# Patient Record
Sex: Male | Born: 1940 | Race: White | Hispanic: No | Smoking: Never smoker
Health system: Southern US, Community
[De-identification: ages and names within clinical notes are randomized; demographics above are authoritative.]

## PROBLEM LIST (undated history)

## (undated) DIAGNOSIS — I10 Essential (primary) hypertension: Secondary | ICD-10-CM

---

## 2016-03-02 ENCOUNTER — Encounter (HOSPITAL_COMMUNITY): Payer: Self-pay | Admitting: Emergency Medicine

## 2016-03-02 ENCOUNTER — Emergency Department (HOSPITAL_COMMUNITY): Payer: PPO

## 2016-03-02 ENCOUNTER — Observation Stay (HOSPITAL_COMMUNITY)
Admission: EM | Admit: 2016-03-02 | Discharge: 2016-03-05 | Disposition: A | Discharge status: Home / Self Care | Payer: PPO | Attending: Orthopedic Surgery | Admitting: Orthopedic Surgery

## 2016-03-02 DIAGNOSIS — I10 Essential (primary) hypertension: Secondary | ICD-10-CM | POA: Insufficient documentation

## 2016-03-02 DIAGNOSIS — Y9389 Activity, other specified: Secondary | ICD-10-CM | POA: Diagnosis not present

## 2016-03-02 DIAGNOSIS — I6523 Occlusion and stenosis of bilateral carotid arteries: Secondary | ICD-10-CM | POA: Diagnosis not present

## 2016-03-02 DIAGNOSIS — I7409 Other arterial embolism and thrombosis of abdominal aorta: Secondary | ICD-10-CM | POA: Insufficient documentation

## 2016-03-02 DIAGNOSIS — S52022A Displaced fracture of olecranon process without intraarticular extension of left ulna, initial encounter for closed fracture: Principal | ICD-10-CM | POA: Insufficient documentation

## 2016-03-02 DIAGNOSIS — S301XXA Contusion of abdominal wall, initial encounter: Secondary | ICD-10-CM | POA: Diagnosis present

## 2016-03-02 DIAGNOSIS — Y92488 Other paved roadways as the place of occurrence of the external cause: Secondary | ICD-10-CM | POA: Insufficient documentation

## 2016-03-02 DIAGNOSIS — S2222XA Fracture of body of sternum, initial encounter for closed fracture: Secondary | ICD-10-CM | POA: Diagnosis not present

## 2016-03-02 DIAGNOSIS — K5730 Diverticulosis of large intestine without perforation or abscess without bleeding: Secondary | ICD-10-CM | POA: Diagnosis not present

## 2016-03-02 DIAGNOSIS — Y998 Other external cause status: Secondary | ICD-10-CM | POA: Diagnosis not present

## 2016-03-02 DIAGNOSIS — I7389 Other specified peripheral vascular diseases: Secondary | ICD-10-CM | POA: Diagnosis not present

## 2016-03-02 DIAGNOSIS — I700 Atherosclerosis of aorta: Secondary | ICD-10-CM | POA: Diagnosis not present

## 2016-03-02 DIAGNOSIS — S52023A Displaced fracture of olecranon process without intraarticular extension of unspecified ulna, initial encounter for closed fracture: Secondary | ICD-10-CM

## 2016-03-02 DIAGNOSIS — S52092B Other fracture of upper end of left ulna, initial encounter for open fracture type I or II: Secondary | ICD-10-CM

## 2016-03-02 DIAGNOSIS — I2510 Atherosclerotic heart disease of native coronary artery without angina pectoris: Secondary | ICD-10-CM | POA: Insufficient documentation

## 2016-03-02 DIAGNOSIS — S42402A Unspecified fracture of lower end of left humerus, initial encounter for closed fracture: Secondary | ICD-10-CM | POA: Diagnosis present

## 2016-03-02 DIAGNOSIS — S2220XA Unspecified fracture of sternum, initial encounter for closed fracture: Secondary | ICD-10-CM | POA: Diagnosis present

## 2016-03-02 DIAGNOSIS — I513 Intracardiac thrombosis, not elsewhere classified: Secondary | ICD-10-CM | POA: Insufficient documentation

## 2016-03-02 DIAGNOSIS — S3991XA Unspecified injury of abdomen, initial encounter: Secondary | ICD-10-CM

## 2016-03-02 HISTORY — DX: Essential (primary) hypertension: I10

## 2016-03-02 LAB — CBC WITH DIFFERENTIAL/PLATELET
BASOS PCT: 0 %
Basophils Absolute: 0 10*3/uL (ref 0.0–0.1)
EOS PCT: 0 %
Eosinophils Absolute: 0 10*3/uL (ref 0.0–0.7)
HEMATOCRIT: 40.6 % (ref 39.0–52.0)
HEMOGLOBIN: 14.7 g/dL (ref 13.0–17.0)
Lymphocytes Relative: 3 %
Lymphs Abs: 0.6 10*3/uL — ABNORMAL LOW (ref 0.7–4.0)
MCH: 31.1 pg (ref 26.0–34.0)
MCHC: 36.2 g/dL — ABNORMAL HIGH (ref 30.0–36.0)
MCV: 86 fL (ref 78.0–100.0)
MONOS PCT: 10 %
Monocytes Absolute: 1.9 10*3/uL — ABNORMAL HIGH (ref 0.1–1.0)
NEUTROS PCT: 87 %
Neutro Abs: 16.3 10*3/uL — ABNORMAL HIGH (ref 1.7–7.7)
PLATELETS: 165 10*3/uL (ref 150–400)
RBC: 4.72 MIL/uL (ref 4.22–5.81)
RDW: 12.8 % (ref 11.5–15.5)
WBC: 18.8 10*3/uL — AB (ref 4.0–10.5)

## 2016-03-02 LAB — COMPREHENSIVE METABOLIC PANEL
ALBUMIN: 3.8 g/dL (ref 3.5–5.0)
ALK PHOS: 73 U/L (ref 38–126)
ALT: 31 U/L (ref 17–63)
ANION GAP: 12 (ref 5–15)
AST: 46 U/L — AB (ref 15–41)
BILIRUBIN TOTAL: 0.6 mg/dL (ref 0.3–1.2)
BUN: 9 mg/dL (ref 6–20)
CO2: 21 mmol/L — ABNORMAL LOW (ref 22–32)
Calcium: 8.5 mg/dL — ABNORMAL LOW (ref 8.9–10.3)
Chloride: 99 mmol/L — ABNORMAL LOW (ref 101–111)
Creatinine, Ser: 0.8 mg/dL (ref 0.61–1.24)
GFR calc Af Amer: >60 mL/min (ref >60)
GFR calc non Af Amer: >60 mL/min (ref >60)
GLUCOSE: 133 mg/dL — AB (ref 65–99)
POTASSIUM: 3.4 mmol/L — AB (ref 3.5–5.1)
SODIUM: 132 mmol/L — AB (ref 135–145)
TOTAL PROTEIN: 6.5 g/dL (ref 6.5–8.1)

## 2016-03-02 LAB — PROTIME-INR
INR: 1.08
PROTHROMBIN TIME: 14.1 s (ref 11.4–15.2)

## 2016-03-02 LAB — TYPE AND SCREEN
ABO/RH(D): O POS
Antibody Screen: NEGATIVE

## 2016-03-02 LAB — ABO/RH: ABO/RH(D): O POS

## 2016-03-02 LAB — TROPONIN I: Troponin I: <0.03 ng/mL (ref <0.03)

## 2016-03-02 LAB — ETHANOL: Alcohol, Ethyl (B): 24 mg/dL — ABNORMAL HIGH (ref <5)

## 2016-03-02 MED ORDER — MORPHINE SULFATE (PF) 4 MG/ML IV SOLN
4.0000 mg | Freq: Once | INTRAVENOUS | Status: AC
  Administered 2016-03-03: 4 mg via INTRAVENOUS
  Filled 2016-03-02: qty 1

## 2016-03-02 MED ORDER — IOPAMIDOL (ISOVUE-300) INJECTION 61%
INTRAVENOUS | Status: AC
  Filled 2016-03-02: qty 100

## 2016-03-02 MED ORDER — IOPAMIDOL (ISOVUE-300) INJECTION 61%
100.0000 mL | Freq: Once | INTRAVENOUS | Status: AC | PRN
  Administered 2016-03-02: 100 mL via INTRAVENOUS

## 2016-03-02 MED ORDER — SODIUM CHLORIDE 0.9 % IV SOLN
Freq: Once | INTRAVENOUS | Status: AC
  Administered 2016-03-02: 23:00:00 via INTRAVENOUS

## 2016-03-02 NOTE — ED Notes (Signed)
Patient transported to X-ray via Doctor, general practicestretcher with transport
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
## 2016-03-02 NOTE — ED Notes (Addendum)
Spoke with pt daughter Delaney Meigsamara. Per pt, staff is allowed to update her on all medical findings during his stay if she calls.  Please update when all results are in and pt plan updated [phone]

## 2016-03-03 ENCOUNTER — Observation Stay (HOSPITAL_COMMUNITY): Payer: PPO | Admitting: Certified Registered"

## 2016-03-03 ENCOUNTER — Encounter (HOSPITAL_COMMUNITY): Payer: Self-pay | Admitting: Certified Registered"

## 2016-03-03 ENCOUNTER — Encounter (HOSPITAL_COMMUNITY)
Admission: EM | Disposition: A | Discharge status: Home / Self Care | Payer: Self-pay | Source: Home / Self Care | Attending: Emergency Medicine

## 2016-03-03 ENCOUNTER — Observation Stay (HOSPITAL_COMMUNITY): Payer: PPO

## 2016-03-03 DIAGNOSIS — I10 Essential (primary) hypertension: Secondary | ICD-10-CM | POA: Diagnosis not present

## 2016-03-03 DIAGNOSIS — S2222XA Fracture of body of sternum, initial encounter for closed fracture: Secondary | ICD-10-CM | POA: Diagnosis not present

## 2016-03-03 DIAGNOSIS — S301XXA Contusion of abdominal wall, initial encounter: Secondary | ICD-10-CM | POA: Diagnosis not present

## 2016-03-03 DIAGNOSIS — S52022A Displaced fracture of olecranon process without intraarticular extension of left ulna, initial encounter for closed fracture: Secondary | ICD-10-CM | POA: Diagnosis not present

## 2016-03-03 HISTORY — PX: ORIF ELBOW FRACTURE: SHX5031

## 2016-03-03 LAB — CBC
HCT: 38.8 % — ABNORMAL LOW (ref 39.0–52.0)
HEMOGLOBIN: 13.5 g/dL (ref 13.0–17.0)
MCH: 30.3 pg (ref 26.0–34.0)
MCHC: 34.8 g/dL (ref 30.0–36.0)
MCV: 87 fL (ref 78.0–100.0)
PLATELETS: 135 10*3/uL — AB (ref 150–400)
RBC: 4.46 MIL/uL (ref 4.22–5.81)
RDW: 13.1 % (ref 11.5–15.5)
WBC: 10.5 10*3/uL (ref 4.0–10.5)

## 2016-03-03 LAB — URINE MICROSCOPIC-ADD ON

## 2016-03-03 LAB — RAPID URINE DRUG SCREEN, HOSP PERFORMED
AMPHETAMINES: NOT DETECTED
BARBITURATES: NOT DETECTED
BENZODIAZEPINES: NOT DETECTED
Cocaine: NOT DETECTED
Opiates: POSITIVE — AB
Tetrahydrocannabinol: NOT DETECTED

## 2016-03-03 LAB — CREATININE, SERUM
CREATININE: 0.92 mg/dL (ref 0.61–1.24)
GFR calc Af Amer: >60 mL/min (ref >60)

## 2016-03-03 LAB — URINALYSIS, ROUTINE W REFLEX MICROSCOPIC
Bilirubin Urine: NEGATIVE
GLUCOSE, UA: NEGATIVE mg/dL
KETONES UR: NEGATIVE mg/dL
LEUKOCYTES UA: NEGATIVE
Nitrite: NEGATIVE
PH: 5.5 (ref 5.0–8.0)
Protein, ur: 100 mg/dL — AB
Specific Gravity, Urine: 1.031 — ABNORMAL HIGH (ref 1.005–1.030)

## 2016-03-03 SURGERY — OPEN REDUCTION INTERNAL FIXATION (ORIF) ELBOW/OLECRANON FRACTURE
Anesthesia: General | Site: Elbow | Laterality: Left

## 2016-03-03 MED ORDER — PHENYLEPHRINE 40 MCG/ML (10ML) SYRINGE FOR IV PUSH (FOR BLOOD PRESSURE SUPPORT)
PREFILLED_SYRINGE | INTRAVENOUS | Status: AC
  Filled 2016-03-03: qty 10

## 2016-03-03 MED ORDER — ACETAMINOPHEN 650 MG RE SUPP: 650.0000 mg | Freq: Four times a day (QID) | RECTAL | Status: DC | PRN

## 2016-03-03 MED ORDER — OXYCODONE HCL 5 MG PO TABS
5.0000 mg | ORAL_TABLET | ORAL | Status: DC | PRN
  Administered 2016-03-04: 15 mg via ORAL
  Administered 2016-03-04 (×2): 10 mg via ORAL
  Administered 2016-03-04 – 2016-03-05 (×2): 15 mg via ORAL
  Filled 2016-03-03: qty 3
  Filled 2016-03-03: qty 2
  Filled 2016-03-03 (×2): qty 3
  Filled 2016-03-03: qty 2

## 2016-03-03 MED ORDER — TETANUS-DIPHTHERIA TOXOIDS TD 2-2 LF/0.5ML IM SUSP
0.5000 mL | Freq: Once | INTRAMUSCULAR | Status: AC
  Administered 2016-03-03: 0.5 mL via INTRAMUSCULAR

## 2016-03-03 MED ORDER — PROPOFOL 10 MG/ML IV BOLUS
INTRAVENOUS | Status: AC
  Filled 2016-03-03: qty 20

## 2016-03-03 MED ORDER — HYDROMORPHONE HCL 2 MG/ML IJ SOLN: 1.0000 mg | INTRAMUSCULAR | Status: DC | PRN

## 2016-03-03 MED ORDER — ENOXAPARIN SODIUM 40 MG/0.4ML ~~LOC~~ SOLN
40.0000 mg | SUBCUTANEOUS | Status: DC
  Administered 2016-03-03: 40 mg via SUBCUTANEOUS
  Filled 2016-03-03: qty 0.4

## 2016-03-03 MED ORDER — BUPIVACAINE HCL (PF) 0.25 % IJ SOLN
INTRAMUSCULAR | Status: AC
  Filled 2016-03-03: qty 10

## 2016-03-03 MED ORDER — SUCCINYLCHOLINE CHLORIDE 20 MG/ML IJ SOLN
INTRAMUSCULAR | Status: DC | PRN
  Administered 2016-03-03: 100 mg via INTRAVENOUS

## 2016-03-03 MED ORDER — EPHEDRINE 5 MG/ML INJ
INTRAVENOUS | Status: AC
  Filled 2016-03-03: qty 10

## 2016-03-03 MED ORDER — HYDROMORPHONE HCL 2 MG/ML IJ SOLN: 0.5000 mg | INTRAMUSCULAR | Status: DC | PRN

## 2016-03-03 MED ORDER — 0.9 % SODIUM CHLORIDE (POUR BTL) OPTIME
TOPICAL | Status: DC | PRN
  Administered 2016-03-03: 1000 mL

## 2016-03-03 MED ORDER — ONDANSETRON HCL 4 MG PO TABS: 4.0000 mg | ORAL_TABLET | Freq: Four times a day (QID) | ORAL | Status: DC | PRN

## 2016-03-03 MED ORDER — DOCUSATE SODIUM 100 MG PO CAPS
100.0000 mg | ORAL_CAPSULE | Freq: Two times a day (BID) | ORAL | Status: DC
  Administered 2016-03-03 – 2016-03-05 (×5): 100 mg via ORAL
  Filled 2016-03-03 (×3): qty 1

## 2016-03-03 MED ORDER — EPHEDRINE SULFATE 50 MG/ML IJ SOLN
INTRAMUSCULAR | Status: DC | PRN
  Administered 2016-03-03 (×2): 10 mg via INTRAVENOUS

## 2016-03-03 MED ORDER — CEFAZOLIN SODIUM-DEXTROSE 2-3 GM-% IV SOLR
INTRAVENOUS | Status: DC | PRN
  Administered 2016-03-03: 2 g via INTRAVENOUS

## 2016-03-03 MED ORDER — ONDANSETRON HCL 4 MG/2ML IJ SOLN
INTRAMUSCULAR | Status: DC | PRN
  Administered 2016-03-03: 4 mg via INTRAVENOUS

## 2016-03-03 MED ORDER — ONDANSETRON HCL 4 MG/2ML IJ SOLN
INTRAMUSCULAR | Status: AC
  Filled 2016-03-03: qty 2

## 2016-03-03 MED ORDER — DEXTROSE-NACL 5-0.9 % IV SOLN
INTRAVENOUS | Status: DC
  Administered 2016-03-03: 08:00:00 via INTRAVENOUS

## 2016-03-03 MED ORDER — BUPIVACAINE HCL 0.25 % IJ SOLN
INTRAMUSCULAR | Status: DC | PRN
  Administered 2016-03-03: 10 mL

## 2016-03-03 MED ORDER — ACETAMINOPHEN 325 MG PO TABS
650.0000 mg | ORAL_TABLET | Freq: Four times a day (QID) | ORAL | Status: DC | PRN
  Administered 2016-03-05: 650 mg via ORAL
  Filled 2016-03-03: qty 2

## 2016-03-03 MED ORDER — LIDOCAINE HCL (CARDIAC) 20 MG/ML IV SOLN
INTRAVENOUS | Status: DC | PRN
  Administered 2016-03-03: 60 mg via INTRAVENOUS

## 2016-03-03 MED ORDER — SODIUM CHLORIDE 0.9 % IR SOLN
Status: DC | PRN
  Administered 2016-03-03: 3000 mL

## 2016-03-03 MED ORDER — PHENYLEPHRINE HCL 10 MG/ML IJ SOLN
INTRAMUSCULAR | Status: DC | PRN
  Administered 2016-03-03 (×2): 80 ug via INTRAVENOUS

## 2016-03-03 MED ORDER — FENTANYL CITRATE (PF) 100 MCG/2ML IJ SOLN
INTRAMUSCULAR | Status: AC
  Filled 2016-03-03: qty 2

## 2016-03-03 MED ORDER — ONDANSETRON HCL 4 MG/2ML IJ SOLN: 4.0000 mg | Freq: Four times a day (QID) | INTRAMUSCULAR | Status: DC | PRN

## 2016-03-03 MED ORDER — ENOXAPARIN SODIUM 30 MG/0.3ML ~~LOC~~ SOLN
30.0000 mg | Freq: Two times a day (BID) | SUBCUTANEOUS | Status: DC
  Administered 2016-03-04 – 2016-03-05 (×3): 30 mg via SUBCUTANEOUS
  Filled 2016-03-03 (×3): qty 0.3

## 2016-03-03 MED ORDER — SUCCINYLCHOLINE CHLORIDE 200 MG/10ML IV SOSY
PREFILLED_SYRINGE | INTRAVENOUS | Status: AC
  Filled 2016-03-03: qty 10

## 2016-03-03 MED ORDER — PROPOFOL 10 MG/ML IV BOLUS
INTRAVENOUS | Status: DC | PRN
  Administered 2016-03-03: 150 mg via INTRAVENOUS
  Administered 2016-03-03: 30 mg via INTRAVENOUS

## 2016-03-03 MED ORDER — TETANUS-DIPHTHERIA TOXOIDS TD 5-2 LFU IM INJ
0.5000 mL | INJECTION | Freq: Once | INTRAMUSCULAR | Status: DC
  Filled 2016-03-03: qty 0.5

## 2016-03-03 MED ORDER — METOCLOPRAMIDE HCL 5 MG/ML IJ SOLN: 5.0000 mg | Freq: Three times a day (TID) | INTRAMUSCULAR | Status: DC | PRN

## 2016-03-03 MED ORDER — METOPROLOL TARTRATE 25 MG PO TABS
50.0000 mg | ORAL_TABLET | Freq: Every day | ORAL | Status: DC
  Administered 2016-03-03 – 2016-03-05 (×3): 50 mg via ORAL
  Filled 2016-03-03 (×3): qty 2

## 2016-03-03 MED ORDER — POLYETHYLENE GLYCOL 3350 17 G PO PACK
17.0000 g | PACK | Freq: Every day | ORAL | Status: DC
  Administered 2016-03-03 – 2016-03-05 (×3): 17 g via ORAL
  Filled 2016-03-03 (×2): qty 1

## 2016-03-03 MED ORDER — CEFAZOLIN SODIUM-DEXTROSE 2-4 GM/100ML-% IV SOLN
2.0000 g | Freq: Four times a day (QID) | INTRAVENOUS | Status: AC
  Administered 2016-03-03 (×3): 2 g via INTRAVENOUS
  Filled 2016-03-03 (×4): qty 100

## 2016-03-03 MED ORDER — KETOTIFEN FUMARATE 0.025 % OP SOLN
1.0000 [drp] | Freq: Two times a day (BID) | OPHTHALMIC | Status: DC | PRN
  Filled 2016-03-03: qty 5

## 2016-03-03 MED ORDER — LACTATED RINGERS IV SOLN
INTRAVENOUS | Status: DC | PRN
  Administered 2016-03-03 (×2): via INTRAVENOUS

## 2016-03-03 MED ORDER — FENTANYL CITRATE (PF) 100 MCG/2ML IJ SOLN
INTRAMUSCULAR | Status: DC | PRN
  Administered 2016-03-03 (×2): 50 ug via INTRAVENOUS

## 2016-03-03 MED ORDER — OXYCODONE HCL 5 MG PO TABS: 5.0000 mg | ORAL_TABLET | ORAL | Status: DC | PRN

## 2016-03-03 MED ORDER — METOCLOPRAMIDE HCL 5 MG PO TABS: 5.0000 mg | ORAL_TABLET | Freq: Three times a day (TID) | ORAL | Status: DC | PRN

## 2016-03-03 MED ORDER — PHENYLEPHRINE HCL 10 MG/ML IJ SOLN
INTRAMUSCULAR | Status: DC | PRN
  Administered 2016-03-03: 50 ug/min via INTRAVENOUS

## 2016-03-03 MED ORDER — KETOROLAC TROMETHAMINE 15 MG/ML IJ SOLN
7.5000 mg | Freq: Four times a day (QID) | INTRAMUSCULAR | Status: AC
  Administered 2016-03-03 (×4): 7.5 mg via INTRAVENOUS
  Filled 2016-03-03 (×4): qty 1

## 2016-03-03 MED ORDER — FLUTICASONE PROPIONATE 50 MCG/ACT NA SUSP
1.0000 | Freq: Every day | NASAL | Status: DC
  Administered 2016-03-03 – 2016-03-04 (×2): 1 via NASAL
  Filled 2016-03-03: qty 16

## 2016-03-03 MED ORDER — CEFAZOLIN IN D5W 1 GM/50ML IV SOLN
1.0000 g | Freq: Once | INTRAVENOUS | Status: AC
  Administered 2016-03-03: 1 g via INTRAVENOUS
  Filled 2016-03-03: qty 50

## 2016-03-03 MED ORDER — DULOXETINE HCL 60 MG PO CPEP
60.0000 mg | ORAL_CAPSULE | Freq: Every day | ORAL | Status: DC
  Administered 2016-03-03 – 2016-03-05 (×3): 60 mg via ORAL
  Filled 2016-03-03 (×3): qty 1

## 2016-03-03 MED ORDER — LIDOCAINE 2% (20 MG/ML) 5 ML SYRINGE
INTRAMUSCULAR | Status: AC
  Filled 2016-03-03: qty 5

## 2016-03-03 MED ORDER — PROMETHAZINE HCL 25 MG/ML IJ SOLN: 6.2500 mg | INTRAMUSCULAR | Status: DC | PRN

## 2016-03-03 MED ORDER — HYDROMORPHONE HCL 1 MG/ML IJ SOLN: 0.2500 mg | INTRAMUSCULAR | Status: DC | PRN

## 2016-03-03 SURGICAL SUPPLY — 72 items
BANDAGE ACE 4X5 VEL STRL LF (GAUZE/BANDAGES/DRESSINGS) ×3 IMPLANT
BANDAGE ELASTIC 3 VELCRO ST LF (GAUZE/BANDAGES/DRESSINGS) ×3 IMPLANT
BANDAGE ELASTIC 4 VELCRO ST LF (GAUZE/BANDAGES/DRESSINGS) ×3 IMPLANT
BENZOIN TINCTURE PRP APPL 2/3 (GAUZE/BANDAGES/DRESSINGS) IMPLANT
BIT DRILL 2.0 (BIT) ×3 IMPLANT
BIT DRILL 2.6 (BIT) ×3 IMPLANT
BLADE SURG 10 STRL SS (BLADE) IMPLANT
BLADE SURG ROTATE 9660 (MISCELLANEOUS) ×3 IMPLANT
BNDG COHESIVE 4X5 TAN STRL (GAUZE/BANDAGES/DRESSINGS) ×3 IMPLANT
BNDG ESMARK 4X9 LF (GAUZE/BANDAGES/DRESSINGS) IMPLANT
BNDG GAUZE ELAST 4 BULKY (GAUZE/BANDAGES/DRESSINGS) ×3 IMPLANT
CANISTER SUCT 3000ML PPV (MISCELLANEOUS) ×3 IMPLANT
CLOSURE WOUND 1/2 X4 (GAUZE/BANDAGES/DRESSINGS)
COVER SURGICAL LIGHT HANDLE (MISCELLANEOUS) ×3 IMPLANT
CUFF TOURNIQUET SINGLE 18IN (TOURNIQUET CUFF) ×3 IMPLANT
CUFF TOURNIQUET SINGLE 24IN (TOURNIQUET CUFF) ×3 IMPLANT
DRAPE C-ARM 42X72 X-RAY (DRAPES) ×3 IMPLANT
DRAPE IMP U-DRAPE 54X76 (DRAPES) ×3 IMPLANT
DRAPE INCISE IOBAN 66X45 STRL (DRAPES) IMPLANT
DRAPE ORTHO SPLIT 77X108 STRL (DRAPES) ×4
DRAPE SURG ORHT 6 SPLT 77X108 (DRAPES) ×2 IMPLANT
DRAPE U-SHAPE 47X51 STRL (DRAPES) ×3 IMPLANT
DRILL OVER 2.7X220 (BIT) ×3 IMPLANT
DRSG PAD ABDOMINAL 8X10 ST (GAUZE/BANDAGES/DRESSINGS) ×3 IMPLANT
DURAPREP 26ML APPLICATOR (WOUND CARE) ×3 IMPLANT
ELECT REM PT RETURN 9FT ADLT (ELECTROSURGICAL) ×3
ELECTRODE REM PT RTRN 9FT ADLT (ELECTROSURGICAL) ×1 IMPLANT
GAUZE SPONGE 4X4 12PLY STRL (GAUZE/BANDAGES/DRESSINGS) ×3 IMPLANT
GAUZE XEROFORM 5X9 LF (GAUZE/BANDAGES/DRESSINGS) ×3 IMPLANT
GLOVE BIO SURGEON STRL SZ7.5 (GLOVE) ×6 IMPLANT
GLOVE BIOGEL PI IND STRL 8 (GLOVE) ×2 IMPLANT
GLOVE BIOGEL PI INDICATOR 8 (GLOVE) ×4
GOWN STRL REUS W/ TWL LRG LVL3 (GOWN DISPOSABLE) ×2 IMPLANT
GOWN STRL REUS W/ TWL XL LVL3 (GOWN DISPOSABLE) ×1 IMPLANT
GOWN STRL REUS W/TWL LRG LVL3 (GOWN DISPOSABLE) ×4
GOWN STRL REUS W/TWL XL LVL3 (GOWN DISPOSABLE) ×2
KIT BASIN OR (CUSTOM PROCEDURE TRAY) ×3 IMPLANT
KIT ROOM TURNOVER OR (KITS) ×3 IMPLANT
MANIFOLD NEPTUNE II (INSTRUMENTS) ×3 IMPLANT
NS IRRIG 1000ML POUR BTL (IV SOLUTION) ×3 IMPLANT
PACK ORTHO EXTREMITY (CUSTOM PROCEDURE TRAY) ×3 IMPLANT
PAD ARMBOARD 7.5X6 YLW CONV (MISCELLANEOUS) ×3 IMPLANT
PAD CAST 4YDX4 CTTN HI CHSV (CAST SUPPLIES) ×1 IMPLANT
PADDING CAST COTTON 4X4 STRL (CAST SUPPLIES) ×2
PLATE COMP NARROW STRT 7H/90MM (Plate) ×3 IMPLANT
SCREW BONE 2.7X18MM (Screw) ×3 IMPLANT
SCREW BONE 3.5X16MM (Screw) ×3 IMPLANT
SCREW BONE 3.5X18MM (Screw) ×6 IMPLANT
SCREW BONE 3.5X20MM (Screw) ×3 IMPLANT
SCREW NON LOCKING 22MM (Screw) ×6 IMPLANT
SPLINT PLASTER CAST XFAST 5X30 (CAST SUPPLIES) ×1 IMPLANT
SPLINT PLASTER XFAST SET 5X30 (CAST SUPPLIES) ×2
SPONGE LAP 18X18 X RAY DECT (DISPOSABLE) ×3 IMPLANT
STAPLER VISISTAT 35W (STAPLE) ×3 IMPLANT
STRIP CLOSURE SKIN 1/2X4 (GAUZE/BANDAGES/DRESSINGS) IMPLANT
SUCTION FRAZIER HANDLE 10FR (MISCELLANEOUS) ×2
SUCTION TUBE FRAZIER 10FR DISP (MISCELLANEOUS) ×1 IMPLANT
SUT ETHILON 3 0 FSL (SUTURE) ×6 IMPLANT
SUT FIBERWIRE #2 38 REV NDL BL (SUTURE)
SUT MNCRL AB 4-0 PS2 18 (SUTURE) ×3 IMPLANT
SUT MON AB 2-0 CT1 36 (SUTURE) ×3 IMPLANT
SUT VIC AB 0 CT1 27 (SUTURE) ×2
SUT VIC AB 0 CT1 27XBRD ANBCTR (SUTURE) ×1 IMPLANT
SUTURE FIBERWR#2 38 REV NDL BL (SUTURE) IMPLANT
SYR CONTROL 10ML LL (SYRINGE) IMPLANT
TOWEL OR 17X24 6PK STRL BLUE (TOWEL DISPOSABLE) ×3 IMPLANT
TOWEL OR 17X26 10 PK STRL BLUE (TOWEL DISPOSABLE) ×3 IMPLANT
TUBE CONNECTING 12'X1/4 (SUCTIONS) ×1
TUBE CONNECTING 12X1/4 (SUCTIONS) ×2 IMPLANT
UNDERPAD 30X30 (UNDERPADS AND DIAPERS) ×3 IMPLANT
WATER STERILE IRR 1000ML POUR (IV SOLUTION) ×3 IMPLANT
YANKAUER SUCT BULB TIP NO VENT (SUCTIONS) ×3 IMPLANT

## 2016-03-03 NOTE — Anesthesia Preprocedure Evaluation (Signed)
Anesthesia Evaluation  Patient identified by MRN, date of birth, ID band Patient awake    Reviewed: Allergy & Precautions, NPO status , Patient's Chart, lab work & pertinent test results  Airway Mallampati: II  TM Distance: >3 FB Neck ROM: Full    Dental no notable dental hx.    Pulmonary neg pulmonary ROS,    Pulmonary exam normal breath sounds clear to auscultation       Cardiovascular hypertension, Normal cardiovascular exam Rhythm:Regular Rate:Normal     Neuro/Psych negative neurological ROS  negative psych ROS   GI/Hepatic negative GI ROS, Neg liver ROS,   Endo/Other  negative endocrine ROS  Renal/GU negative Renal ROS  negative genitourinary   Musculoskeletal negative musculoskeletal ROS (+)   Abdominal   Peds negative pediatric ROS (+)  Hematology negative hematology ROS (+)   Anesthesia Other Findings   Reproductive/Obstetrics negative OB ROS                             Anesthesia Physical Anesthesia Plan  ASA: II  Anesthesia Plan: General   Post-op Pain Management:    Induction: Intravenous  Airway Management Planned: Oral ETT  Additional Equipment:   Intra-op Plan:   Post-operative Plan: Extubation in OR  Informed Consent: I have reviewed the patients History and Physical, chart, labs and discussed the procedure including the risks, benefits and alternatives for the proposed anesthesia with the patient or authorized representative who has indicated his/her understanding and acceptance.   Dental advisory given  Plan Discussed with: CRNA and Surgeon  Anesthesia Plan Comments:         Anesthesia Quick Evaluation  

## 2016-03-03 NOTE — Progress Notes (Signed)
Left upper extremity is NVI, splint intact Pain controlled   Ok for d/c from ortho standpoint F/u in 1wk at my office.     Mateusz Neilan D
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
## 2016-03-03 NOTE — Anesthesia Postprocedure Evaluation (Signed)
Anesthesia Post Note  Patient: Marvis MoellerFrank Bhandari  Procedure(s) Performed: Procedure(s) (LRB): OPEN REDUCTION INTERNAL FIXATION (ORIF) ELBOW/OLECRANON FRACTURE (Left)  Patient location during evaluation: PACU Anesthesia Type: General Level of consciousness: awake and alert Pain management: pain level controlled Vital Signs Assessment: post-procedure vital signs reviewed and stable Respiratory status: spontaneous breathing, nonlabored ventilation, respiratory function stable and patient connected to nasal cannula oxygen Cardiovascular status: blood pressure returned to baseline and stable Postop Assessment: no signs of nausea or vomiting Anesthetic complications: no    Last Vitals:  Vitals:   03/03/16 0500 03/03/16 0530  BP: (!) 151/68 (!) 158/70  Pulse: 81 78  Resp: (!) 21 19  Temp:  36.4 C    Last Pain:  Vitals:   03/03/16 0530  TempSrc: Axillary  PainSc:                  Joran Kallal S
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
## 2016-03-03 NOTE — ED Notes (Signed)
Ramirez, MD at bedside. 
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
## 2016-03-03 NOTE — Anesthesia Procedure Notes (Signed)
Procedure Name: Intubation Date/Time: 03/03/2016 2:45 AM Performed by: Arlice ColtMANESS, Angellina Ferdinand B Pre-anesthesia Checklist: Patient identified, Emergency Drugs available, Suction available, Patient being monitored and Timeout performed Patient Re-evaluated:Patient Re-evaluated prior to inductionOxygen Delivery Method: Circle system utilized Preoxygenation: Pre-oxygenation with 100% oxygen Intubation Type: IV induction and Rapid sequence Laryngoscope Size: Mac and 3 Grade View: Grade I Tube type: Oral Tube size: 7.5 mm Number of attempts: 1 Airway Equipment and Method: Stylet Placement Confirmation: ETT inserted through vocal cords under direct vision,  positive ETCO2 and breath sounds checked- equal and bilateral Secured at: 22 cm Tube secured with: Tape Dental Injury: Teeth and Oropharynx as per pre-operative assessment

## 2016-03-03 NOTE — Transfer of Care (Signed)
Immediate Anesthesia Transfer of Care Note  Patient: Riley Mccall  Procedure(s) Performed: Procedure(s): OPEN REDUCTION INTERNAL FIXATION (ORIF) ELBOW/OLECRANON FRACTURE (Left)  Patient Location: PACU  Anesthesia Type:General  Level of Consciousness: awake, alert  and oriented  Airway & Oxygen Therapy: Patient Spontanous Breathing and Patient connected to face mask oxygen  Post-op Assessment: Report given to RN and Post -op Vital signs reviewed and stable  Post vital signs: Reviewed and stable  Last Vitals:  Vitals:   03/02/16 2100 03/03/16 0425  BP: 156/82 (!) 148/73  Pulse: 76 85  Resp: 20 (!) 24  Temp:  36.5 C    Last Pain:  Vitals:   03/03/16 0425  TempSrc:   PainSc: Asleep         Complications: No apparent anesthesia complications
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
## 2016-03-04 DIAGNOSIS — S42402A Unspecified fracture of lower end of left humerus, initial encounter for closed fracture: Secondary | ICD-10-CM | POA: Diagnosis present

## 2016-03-04 DIAGNOSIS — S2220XA Unspecified fracture of sternum, initial encounter for closed fracture: Secondary | ICD-10-CM | POA: Diagnosis present

## 2016-03-04 DIAGNOSIS — S301XXA Contusion of abdominal wall, initial encounter: Secondary | ICD-10-CM | POA: Diagnosis present

## 2016-03-04 LAB — EXPECTORATED SPUTUM ASSESSMENT W REFEX TO RESP CULTURE

## 2016-03-04 LAB — EXPECTORATED SPUTUM ASSESSMENT W GRAM STAIN, RFLX TO RESP C

## 2016-03-04 MED ORDER — GUAIFENESIN ER 600 MG PO TB12
1200.0000 mg | ORAL_TABLET | Freq: Two times a day (BID) | ORAL | Status: DC
  Administered 2016-03-04 – 2016-03-05 (×3): 1200 mg via ORAL
  Filled 2016-03-04 (×3): qty 2

## 2016-03-04 MED ORDER — OXYCODONE-ACETAMINOPHEN 7.5-325 MG PO TABS: 1.0000 | ORAL_TABLET | ORAL | 0 refills | Status: AC | PRN

## 2016-03-04 NOTE — Care Management Obs Status (Signed)
MEDICARE OBSERVATION STATUS NOTIFICATION   Patient Details  Name: Riley Mccall MRN: [id number] Date of Birth: 11/12/1940   Medicare Observation Status Notification Given:  Yes    Glennon Macmerson, Isabella Roemmich M, RN 03/04/2016, 3:37 PM
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
## 2016-03-04 NOTE — Care Management Note (Signed)
Case Management Note  Patient Details  Name: Riley Mccall MRN: [id number] Date of Birth: 08/15/1940  Subjective/Objective: Pt medically stable for discharge home today.  Pt states family members to provide 24h care at dc.   HHPT/OT ordered, and pt agreeable to [facility].                 Action/Plan: [facility] per pt choice; start of care 24-48h post dc date.    Expected Discharge Date:      03/04/16            Expected Discharge Plan:  Home w Home Health Services  In-House Referral:     Discharge planning Services  CM Consult  Post Acute Care Choice:    Choice offered to:  Patient  DME Arranged:    DME Agency:     HH Arranged:  PT, OT HH Agency:  [facility]  Status of Service:  Completed, signed off  If discussed at Long Length of Stay Meetings, dates discussed:    Additional Comments:  Quintella BatonJulie W. Jasalyn Frysinger, RN, BSN  Trauma/Neuro ICU Case Manager [phone]
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
## 2016-03-04 NOTE — Op Note (Signed)
03/02/2016 - 03/03/2016  12:59 PM  PATIENT:  Riley Mccall    PRE-OPERATIVE DIAGNOSIS:  Open left elbow  POST-OPERATIVE DIAGNOSIS:  Same  PROCEDURE:  OPEN REDUCTION INTERNAL FIXATION (ORIF) ELBOW/OLECRANON FRACTURE  SURGEON:  Devonte Migues, Jewel BaizeIMOTHY D, MD  ASSISTANT: Aquilla HackerHenry Martensen, PA-C, he was present and scrubbed throughout the case, critical for completion in a timely fashion, and for retraction, instrumentation, and closure.   ANESTHESIA:   gen  PREOPERATIVE INDICATIONS:  Riley Mccall is a  75 y.o. male with a diagnosis of Open left elbow who failed conservative measures and elected for surgical management.    The risks benefits and alternatives were discussed with the patient preoperatively including but not limited to the risks of infection, bleeding, nerve injury, cardiopulmonary complications, the need for revision surgery, among others, and the patient was willing to proceed.  OPERATIVE IMPLANTS: stryker plate  OPERATIVE FINDINGS: stable Radial head redctuction  BLOOD LOSS: min  COMPLICATIONS: none  TOURNIQUET TIME: 60 min  OPERATIVE PROCEDURE:  Patient was identified in the preoperative holding area and site was marked by me He was transported to the operating theater and placed on the table in supine position taking care to pad all bony prominences. After a preincinduction time out anesthesia was induced. The left upper extremity was prepped and draped in normal sterile fashion and a pre-incision timeout was performed. He received ancef for preoperative antibiotics.   I performed a thorough irrigation of his open wound with copious fluid there is no gross contamination. I then extended this incision proximally to expose his fracture. I debrided fracture ends.  I did another copious irrigation.  I then reduced his fracture and placed a lag screw across it with good purchase.  I then placed a stryker titanium plate with 3 bicortical screws on both sides of the fracture these  all had excellent purchase.  I took multiple x-rays as happy with the reduction and placement of all hardware I stressed his elbow joint his radial head remained stable through all range of motion range of motion.  I then thoroughly irrigated his wound I closed his surgical incision with a nylon stitch I loosely closed the traumatic wound to allow for drainage as needed.  Sterile dressings were applied his placed in a long-arm splint is then awoken and taken the PACU in stable condition  POST OPERATIVE PLAN: mobilize for dvt px. Sling and splint until follow up

## 2016-03-05 NOTE — Clinical Social Work Note (Signed)
CSW [facility] that the patient has been denied SNF at this time. Patient is wanting to go home at time of discharge.  RNCM notified.  Vickii PennaGina Lynnlee Revels, MSW, LCSW  [phone]  Licensed Clinical Social Worker
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
## 2016-03-05 NOTE — Progress Notes (Signed)
This RN provided discharge instructions to pt and his daughter, and pt received his pain prescription. IV/d/c without complication. Pt is ready to discharge.

## 2016-03-05 NOTE — Progress Notes (Signed)
Per night shift nurse, pt supposed to discharge yesterday and need a ride from a Family member. Patient states that his close friend has been notified to pick him up and her daughter also drove toward him from [place]. paln discharge pt once ride is here.

## 2016-03-05 NOTE — Discharge Instructions (Signed)
Keep splint and bandages on, clean, and dry until follow up in the office.  Activity:  Increase activity slowly as tolerated, but follow the weight bearing instructions below.  The rules on driving is that you can not be taking narcotics while you drive, and you must feel in control of the vehicle.    Weight Bearing:   Non weight bearing left arm    To prevent constipation: you may use a stool softener such as -  Colace (over the counter) 100 mg by mouth twice a day  Drink plenty of fluids (prune juice may be helpful) and high fiber foods Miralax (over the counter) for constipation as needed.    Itching:  If you experience itching with your medications, try taking only a single pain pill, or even half a pain pill at a time.  You may take up to 6 pain pills per day, and you can also use benadryl over the counter for itching or also to help with sleep.   Precautions:  If you experience chest pain or shortness of breath - call 911 immediately for transfer to the hospital emergency department!!  If you develop a fever greater that 101 F, purulent drainage from wound, increased redness or drainage from wound, or calf pain -- Call the office at [phone]                                                Follow- Up Appointment:  Please call for an appointment to be seen in 1 week [place] - [phone]
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
## 2016-03-05 NOTE — Care Management Note (Signed)
Case Management Note  Patient Details  Name: Marquel Pottenger MRN: [id number] Date of Birth: 08/03/1940  Subjective/Objective:   Notified by bedside nurse that daughter is here from [place], and is upset; states she wanted pt to go to SNF, as she will only be in town until Sunday.                 Action/Plan: Met with pt and daughter in room.  We discussed PT/OT recommendations.  Daughter states pt will not have 24/7 supervision at dc, though this is not recommended by PT/OT.  Pt states he does have neighbors who can check on him frequently, and his son in [place] is available to assist.  CSW did fax out pt and submitted pt for insurance auth for SNF, but insurance denied.  Daughter made aware of this.  Suggested adding HHA to home health orders to assist with bathing, and daughter is satisfied with this.  Pt adamantly refusing to go to SNF, and is competent and alert.  Pt/daughter agreeable to going home with [facility] services as previously ordered.    Expected Discharge Date:    03/05/16            Expected Discharge Plan:  [place]  In-House Referral:     Discharge planning Services  CM Consult  Post Acute Care Choice:    Choice offered to:  Patient  DME Arranged:    DME Agency:     HH Arranged:  PT, OT, Nurse's Aide [place] Agency:  [place]  Status of Service:  Completed, signed off  If discussed at [place] of Stay Meetings, dates discussed:    Additional Comments:  Reinaldo Raddle, RN, BSN  Trauma/Neuro ICU Case Manager [phone]

## 2016-03-06 LAB — CULTURE, RESPIRATORY W GRAM STAIN: Culture: NORMAL

## 2016-03-06 LAB — CULTURE, RESPIRATORY

## 2016-03-06 NOTE — OR Nursing (Signed)
Late entry for additional implant charge overlooked at time of surgery.

## 2016-03-07 ENCOUNTER — Encounter (HOSPITAL_COMMUNITY): Payer: Self-pay | Admitting: Orthopedic Surgery

## 2016-03-08 ENCOUNTER — Telehealth (HOSPITAL_COMMUNITY): Payer: Self-pay
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
